# Patient Record
Sex: Male | Born: 1990 | Race: White | Hispanic: No | Marital: Married | Smoking: Never smoker
Health system: Southern US, Community
[De-identification: ages and names within clinical notes are randomized; demographics above are authoritative.]

## PROBLEM LIST (undated history)

## (undated) HISTORY — PX: WISDOM TOOTH EXTRACTION: SHX21

---

## 2019-06-10 ENCOUNTER — Emergency Department (HOSPITAL_COMMUNITY): Payer: Self-pay

## 2019-06-10 ENCOUNTER — Emergency Department (HOSPITAL_COMMUNITY)
Admission: EM | Admit: 2019-06-10 | Discharge: 2019-06-11 | Disposition: A | Discharge status: Home / Self Care | Payer: Self-pay | Attending: Emergency Medicine | Admitting: Emergency Medicine

## 2019-06-10 ENCOUNTER — Encounter (HOSPITAL_COMMUNITY): Payer: Self-pay

## 2019-06-10 ENCOUNTER — Other Ambulatory Visit: Payer: Self-pay

## 2019-06-10 DIAGNOSIS — Z20828 Contact with and (suspected) exposure to other viral communicable diseases: Secondary | ICD-10-CM | POA: Insufficient documentation

## 2019-06-10 DIAGNOSIS — J189 Pneumonia, unspecified organism: Secondary | ICD-10-CM | POA: Insufficient documentation

## 2019-06-10 LAB — CBC
HCT: 43 % (ref 39.0–52.0)
Hemoglobin: 14.4 g/dL (ref 13.0–17.0)
MCH: 31.6 pg (ref 26.0–34.0)
MCHC: 33.5 g/dL (ref 30.0–36.0)
MCV: 94.3 fL (ref 80.0–100.0)
Platelets: 243 10*3/uL (ref 150–400)
RBC: 4.56 MIL/uL (ref 4.22–5.81)
RDW: 12.2 % (ref 11.5–15.5)
WBC: 13.3 10*3/uL — ABNORMAL HIGH (ref 4.0–10.5)
nRBC: 0 % (ref 0.0–0.2)

## 2019-06-10 MED ORDER — SODIUM CHLORIDE 0.9% FLUSH: 3.0000 mL | Freq: Once | INTRAVENOUS | Status: DC

## 2019-06-10 NOTE — ED Triage Notes (Signed)
Pt states that two days ago he began having L sided CP that radiates to back, some SOB, denies n/v.

## 2019-06-10 NOTE — ED Notes (Signed)
Travis Huerta [phone] would like to be called when she can come back with patient

## 2019-06-11 ENCOUNTER — Emergency Department (HOSPITAL_COMMUNITY): Payer: Self-pay

## 2019-06-11 LAB — BASIC METABOLIC PANEL
Anion gap: 12 (ref 5–15)
BUN: 10 mg/dL (ref 6–20)
CO2: 26 mmol/L (ref 22–32)
Calcium: 9.3 mg/dL (ref 8.9–10.3)
Chloride: 99 mmol/L (ref 98–111)
Creatinine, Ser: 1.06 mg/dL (ref 0.61–1.24)
GFR calc Af Amer: >60 mL/min (ref >60)
GFR calc non Af Amer: >60 mL/min (ref >60)
Glucose, Bld: 114 mg/dL — ABNORMAL HIGH (ref 70–99)
Potassium: 3.8 mmol/L (ref 3.5–5.1)
Sodium: 137 mmol/L (ref 135–145)

## 2019-06-11 LAB — TROPONIN I (HIGH SENSITIVITY)
Troponin I (High Sensitivity): <2 ng/L (ref <18)
Troponin I (High Sensitivity): 3 ng/L (ref <18)

## 2019-06-11 LAB — SARS CORONAVIRUS 2 (TAT 6-24 HRS): SARS Coronavirus 2: NEGATIVE

## 2019-06-11 MED ORDER — AMOXICILLIN 500 MG PO CAPS: 1000.0000 mg | ORAL_CAPSULE | Freq: Two times a day (BID) | ORAL | 0 refills | Status: DC

## 2019-06-11 MED ORDER — AZITHROMYCIN 250 MG PO TABS: 250.0000 mg | ORAL_TABLET | Freq: Every day | ORAL | 0 refills | Status: DC

## 2019-06-11 MED ORDER — HYDROCODONE-ACETAMINOPHEN 5-325 MG PO TABS: 1.0000 | ORAL_TABLET | ORAL | 0 refills | Status: DC | PRN

## 2019-06-11 MED ORDER — SODIUM CHLORIDE 0.9 % IV BOLUS
1000.0000 mL | Freq: Once | INTRAVENOUS | Status: AC
  Administered 2019-06-11: 06:00:00 1000 mL via INTRAVENOUS

## 2019-06-11 MED ORDER — IOHEXOL 350 MG/ML SOLN
75.0000 mL | Freq: Once | INTRAVENOUS | Status: AC | PRN
  Administered 2019-06-11: 06:00:00 75 mL via INTRAVENOUS

## 2019-06-11 NOTE — ED Provider Notes (Signed)
[facility] EMERGENCY DEPARTMENT Provider Note   CSN: [id number] Arrival date & time: 06/10/19  2314    History   Chief Complaint Chief Complaint  Patient presents with  . Chest Pain    HPI Travis Huerta is a 28 y.o. male.     Patient presents to the emergency department for evaluation of chest pain.  Patient had onset of left-sided chest pain 2 days ago.  Pain has been waxing and waning but persistent.  Pain is in the left side of his chest and radiates through into his back and into the left shoulder area.  He reports significant worsening of pain with taking deep breaths.  He does not feel particularly short of breath.  No history of DVT or PE.  He has not noticed any calf or leg swelling or pain.  He did, however, fly to [place] state 3 weeks ago.  No recent surgery.     History reviewed. No pertinent past medical history.  There are no active problems to display for this patient.   History reviewed. No pertinent surgical history.      Home Medications    Prior to Admission medications   Medication Sig Start Date End Date Taking? Authorizing Provider  ibuprofen (ADVIL) 200 MG tablet Take 200 mg by mouth every 6 (six) hours as needed for mild pain.   Yes [provider]    Family History No family history on file.  Social History Social History   Tobacco Use  . Smoking status: Never Smoker  . Smokeless tobacco: Never Used  Substance Use Topics  . Alcohol use: Yes  . Drug use: Never     Allergies   Patient has no known allergies.   Review of Systems Review of Systems  Cardiovascular: Positive for chest pain.  All other systems reviewed and are negative.    Physical Exam Updated Vital Signs BP (!) 126/91   Pulse (!) 109   Temp 98 F (36.7 C) (Oral)   Resp (!) 25   Ht 5\' 7"  (1.702 m)   Wt 81.6 kg   SpO2 98%   BMI 28.19 kg/m   Physical Exam Vitals signs and nursing note reviewed.  Constitutional:    General: He is not in acute distress.    Appearance: Normal appearance. He is well-developed.  HENT:     Head: Normocephalic and atraumatic.     Right Ear: Hearing normal.     Left Ear: Hearing normal.     Nose: Nose normal.  Eyes:     Conjunctiva/sclera: Conjunctivae normal.     Pupils: Pupils are equal, round, and reactive to light.  Neck:     Musculoskeletal: Normal range of motion and neck supple.  Cardiovascular:     Rate and Rhythm: Regular rhythm. Tachycardia present.     Heart sounds: S1 normal and S2 normal. No murmur. No friction rub. No gallop.   Pulmonary:     Effort: Pulmonary effort is normal. No respiratory distress.     Breath sounds: Normal breath sounds.  Chest:     Chest wall: No tenderness.  Abdominal:     General: Bowel sounds are normal.     Palpations: Abdomen is soft.     Tenderness: There is no abdominal tenderness. There is no guarding or rebound. Negative signs include Murphy's sign and McBurney's sign.     Hernia: No hernia is present.  Musculoskeletal: Normal range of motion.  Skin:    General: Skin  is warm and dry.     Findings: No rash.  Neurological:     Mental Status: He is alert and oriented to person, place, and time.     GCS: GCS eye subscore is 4. GCS verbal subscore is 5. GCS motor subscore is 6.     Cranial Nerves: No cranial nerve deficit.     Sensory: No sensory deficit.     Coordination: Coordination normal.  Psychiatric:        Speech: Speech normal.        Behavior: Behavior normal.        Thought Content: Thought content normal.      ED Treatments / Results  Labs (all labs ordered are listed, but only abnormal results are displayed) Labs Reviewed  BASIC METABOLIC PANEL - Abnormal; Notable for the following components:      Result Value   Glucose, Bld 114 (*)    All other components within normal limits  CBC - Abnormal; Notable for the following components:   WBC 13.3 (*)    All other components within normal limits   NOVEL CORONAVIRUS, NAA (HOSPITAL ORDER, SEND-OUT TO REF LAB)  TROPONIN I (HIGH SENSITIVITY)  TROPONIN I (HIGH SENSITIVITY)    EKG EKG Interpretation  Date/Time:  Friday June 10 2019 23:19:49 EDT Ventricular Rate:  120 PR Interval:  138 QRS Duration: 80 QT Interval:  312 QTC Calculation: 440 R Axis:   80 Text Interpretation:  Sinus tachycardia Nonspecific T wave abnormality Abnormal ECG No previous tracing Confirmed by Orpah Greek [phone]) on 06/11/2019 4:33:11 AM   Radiology Dg Chest 2 View  Result Date: 06/10/2019 CLINICAL DATA:  Chest pain and shortness of breath EXAM: CHEST - 2 VIEW COMPARISON:  None. FINDINGS: Cardiac shadows within normal limits. The lungs are well aerated bilaterally. Mild left basilar atelectasis/infiltrate is seen. No sizable effusion is noted. No bony abnormality is seen. IMPRESSION: Opacities in the left base which may represent atelectasis or early infiltrate. Electronically Signed   By: Inez Catalina M.D.   On: 06/10/2019 23:54   Ct Angio Chest Pe W Or Wo Contrast  Result Date: 06/11/2019 CLINICAL DATA:  Chest pain and dyspnea for 2 days. EXAM: CT ANGIOGRAPHY CHEST WITH CONTRAST TECHNIQUE: Multidetector CT imaging of the chest was performed using the standard protocol during bolus administration of intravenous contrast. Multiplanar CT image reconstructions and MIPs were obtained to evaluate the vascular anatomy. CONTRAST:  21mL OMNIPAQUE IOHEXOL 350 MG/ML SOLN COMPARISON:  Chest radiograph from one day prior. FINDINGS: Cardiovascular: The study is moderate quality for the evaluation of pulmonary embolism, with mild motion degradation. There are no convincing filling defects in the central, lobar, segmental or subsegmental pulmonary artery branches to suggest acute pulmonary embolism. Great vessels are normal in course and caliber. Normal heart size. No significant pericardial fluid/thickening. Mediastinum/Nodes: No discrete thyroid nodules. Unremarkable  esophagus. No pathologically enlarged axillary, mediastinal or hilar lymph nodes. Lungs/Pleura: No pneumothorax. Small dependent left pleural effusion. No right pleural effusion. Patchy consolidation in the lingula and left lower lobe with surrounding patchy ground-glass opacity. No lung masses or significant pulmonary nodules. Upper abdomen: No acute abnormality. Musculoskeletal:  No aggressive appearing focal osseous lesions. Review of the MIP images confirms the above findings. IMPRESSION: 1. Mildly motion degraded scan. No convincing evidence of pulmonary embolism. 2. Small dependent left pleural effusion. 3. Patchy consolidation in the lingula and left lower lobe with surrounding patchy ground-glass opacity, suspect a combination of pneumonia and atelectasis. Electronically Signed  By: Ilona Sorrel M.D.   On: 06/11/2019 05:48    Procedures Procedures (including critical care time)  Medications Ordered in ED Medications  sodium chloride flush (NS) 0.9 % injection 3 mL (has no administration in time range)  sodium chloride 0.9 % bolus 1,000 mL (1,000 mLs Intravenous New Bag/Given 06/11/19 0603)  iohexol (OMNIPAQUE) 350 MG/ML injection 75 mL (75 mLs Intravenous Contrast Given 06/11/19 0533)     Initial Impression / Assessment and Plan / ED Course  I have reviewed the triage vital signs and the nursing notes.  Pertinent labs & imaging results that were available during my care of the patient were reviewed by me and considered in my medical decision making (see chart for details).        Patient presents for evaluation of chest pain.  Patient reports several days of left-sided chest pain, worse tonight.  He has noticed increased pain when he lies down.  No cardiac risk factors.  Cardiac evaluation unremarkable.  X-ray shows evidence of infiltrate at the left base.  Patient does have a pleuritic component to his pain and is tachycardic.  He does, however, reports that his heart rate is normally in  the 100s.  Patient recently traveled by plane to [place] state.  He therefore does have risk factors for PE.  CT angiography of chest was performed.  No evidence of PE noted but area seen on x-ray is consistent with pneumonia.  Patient's tachycardia improving with IV fluids.  Heart rate in the 110 range now.  He appears well otherwise.  No hypoxia.  Will treat with antibiotics and analgesia, appropriate for outpatient management.  Patient given strict return precautions.  Will perform outpatient COVID-19 testing.  Final Clinical Impressions(s) / ED Diagnoses   Final diagnoses:  Community acquired pneumonia of left lower lobe of lung [facility])    ED Discharge Orders    None       Koraline Phillipson, Gwenyth Allegra, MD 06/11/19 [phone]

## 2019-06-11 NOTE — ED Notes (Signed)
The pt has had sob for 2-3 days  Recent  50 mile hike in [place] state  No leg pain jsut sob and some chest pain

## 2019-06-11 NOTE — ED Notes (Signed)
Pt returned from c-t  Cannot lie back without severe chest pain.. more comfortable whenever he sits straight upright

## 2019-06-24 ENCOUNTER — Other Ambulatory Visit: Payer: Self-pay

## 2019-06-24 ENCOUNTER — Encounter: Payer: Self-pay | Admitting: Critical Care Medicine

## 2019-06-24 ENCOUNTER — Ambulatory Visit (INDEPENDENT_AMBULATORY_CARE_PROVIDER_SITE_OTHER): Payer: Self-pay

## 2019-06-24 ENCOUNTER — Ambulatory Visit (INDEPENDENT_AMBULATORY_CARE_PROVIDER_SITE_OTHER): Payer: Self-pay | Admitting: Critical Care Medicine

## 2019-06-24 VITALS — BP 128/80 | HR 102 | Temp 98.4°F | Ht 67.0 in | Wt 189.2 lb

## 2019-06-24 DIAGNOSIS — J189 Pneumonia, unspecified organism: Secondary | ICD-10-CM

## 2019-06-24 DIAGNOSIS — J90 Pleural effusion, not elsewhere classified: Secondary | ICD-10-CM

## 2019-06-24 DIAGNOSIS — J918 Pleural effusion in other conditions classified elsewhere: Secondary | ICD-10-CM

## 2019-06-24 NOTE — Patient Instructions (Signed)
Thank you for visiting Dr. Tonnette Zwiebel at [facility]. We recommend the following:   Return if symptoms worsen or fail to improve.    Please do your part to reduce the spread of COVID-19. ' 

## 2019-06-24 NOTE — Progress Notes (Signed)
Synopsis: Referred in August 2020 for follow-up of community-acquired pneumonia by Joseph Berkshire, MD.  Subjective:   PATIENT ID: Travis Huerta GENDER: male DOB: 06/28/1991, MRN: [id number]  Chief Complaint  Patient presents with  . Consult    F/U for PNA. Was seen in the ED on 8/7 for PNA. States he is feeling much better.     Mr. Weiand is a 28 year old gentleman who was recently evaluated in the emergency department and found to have a left lower lobe pneumonia. At that time he was experiencing pleuritic left-sided CP and SOB due to the pain, but denies having had cough, sputum production, fevers, or chills. In the emergency department a CTA was performed due to concern for a PE given recent prolonged air travel cross-country, demonstrated a dependent pleural effusion on the left..  Was prescribed a 10-day course of amoxicillin and a Z-Pack in the ED. He completed his antibiotics and reports almost complete resolution of the pain.  He only occasionally has pain with coughing.  No shortness of breath, chills, or fevers.  Prior to his illness he did not have any sick contacts.  He has no history of pulmonary infections or immune deficiencies in the past.  He is a never-smoker/vaper.     History reviewed. No pertinent past medical history.   Family History  Problem Relation Age of Onset  . Hypertension Father   . Diabetes Maternal Grandmother      Past Surgical History:  Procedure Laterality Date  . WISDOM TOOTH EXTRACTION      Social History   Socioeconomic History  . Marital status: Single    Spouse name: Not on file  . Number of children: Not on file  . Years of education: Not on file  . Highest education level: Not on file  Occupational History  . Not on file  Social Needs  . Financial resource strain: Not on file  . Food insecurity    Worry: Not on file    Inability: Not on file  . Transportation needs    Medical: Not on file    Non-medical: Not on file   Tobacco Use  . Smoking status: Never Smoker  . Smokeless tobacco: Never Used  Substance and Sexual Activity  . Alcohol use: Yes    Comment: occasional on the weekends  . Drug use: Never  . Sexual activity: Not on file  Lifestyle  . Physical activity    Days per week: Not on file    Minutes per session: Not on file  . Stress: Not on file  Relationships  . Social Herbalist on phone: Not on file    Gets together: Not on file    Attends religious service: Not on file    Active member of club or organization: Not on file    Attends meetings of clubs or organizations: Not on file    Relationship status: Not on file  . Intimate partner violence    Fear of current or ex partner: Not on file    Emotionally abused: Not on file    Physically abused: Not on file    Forced sexual activity: Not on file  Other Topics Concern  . Not on file  Social History Narrative  . Not on file     No Known Allergies    There is no immunization history on file for this patient.  Outpatient Medications Prior to Visit  Medication Sig Dispense Refill  . amoxicillin (AMOXIL)  500 MG capsule Take 2 capsules (1,000 mg total) by mouth 2 (two) times daily. 40 capsule 0  . azithromycin (ZITHROMAX) 250 MG tablet Take 1 tablet (250 mg total) by mouth daily. Take first 2 tablets together, then 1 every day until finished. 6 tablet 0  . HYDROcodone-acetaminophen (NORCO/VICODIN) 5-325 MG tablet Take 1 tablet by mouth every 4 (four) hours as needed for moderate pain. 10 tablet 0  . ibuprofen (ADVIL) 200 MG tablet Take 200 mg by mouth every 6 (six) hours as needed for mild pain.     No facility-administered medications prior to visit.     Review of Systems  Constitutional: Negative for chills, fever and weight loss.  HENT: Negative.   Eyes: Negative.   Respiratory: Negative for sputum production, shortness of breath and wheezing.   Cardiovascular: Negative for chest pain and leg swelling.   Gastrointestinal: Negative for blood in stool, nausea and vomiting.  Genitourinary: Negative.   Musculoskeletal: Negative for myalgias.  Skin: Negative for itching and rash.  Neurological: Negative.      Objective:   Vitals:   06/24/19 1423  BP: 128/80  Pulse: (!) 102  Temp: 98.4 F (36.9 C)  TempSrc: Oral  SpO2: 99%  Weight: 189 lb 3.2 oz (85.8 kg)  Height: 5\' 7"  (1.702 m)   99% on RA BMI Readings from Last 3 Encounters:  06/24/19 29.63 kg/m  06/10/19 28.19 kg/m   Wt Readings from Last 3 Encounters:  06/24/19 189 lb 3.2 oz (85.8 kg)  06/10/19 180 lb (81.6 kg)    Physical Exam Vitals signs reviewed.  Constitutional:      Appearance: Normal appearance. He is not ill-appearing.  HENT:     Head: Normocephalic and atraumatic.     Nose:     Comments: Deferred due to masking requirement.    Mouth/Throat:     Comments: Deferred due to masking requirement. Neck:     Musculoskeletal: Neck supple.  Cardiovascular:     Heart sounds: No murmur.     Comments: Mild tachycardia, regular rhythm Pulmonary:     Comments: Clear to auscultation bilaterally, breathing comfortably on room air and speaking in full sentences. Abdominal:     General: Abdomen is flat. There is no distension.     Palpations: Abdomen is soft.  Musculoskeletal:        General: No swelling or tenderness.  Lymphadenopathy:     Cervical: No cervical adenopathy.  Skin:    General: Skin is warm and dry.     Findings: No rash.  Neurological:     Mental Status: He is alert. Mental status is at baseline.     Motor: No weakness.     Coordination: Coordination normal.     Gait: Gait normal.  Psychiatric:        Mood and Affect: Mood normal.        Behavior: Behavior normal.      CBC    Component Value Date/Time   WBC 13.3 (H) 06/10/2019 2335   RBC 4.56 06/10/2019 2335   HGB 14.4 06/10/2019 2335   HCT 43.0 06/10/2019 2335   PLT 243 06/10/2019 2335   MCV 94.3 06/10/2019 2335   MCH 31.6  06/10/2019 2335   MCHC 33.5 06/10/2019 2335   RDW 12.2 06/10/2019 2335    Labs from his ED visit on 06/10/2019 demonstrated a leukocytosis, essentially normal basic metabolic panel, and normal troponin levels.  SARS-CoV-2 negative.  Chest Imaging- films reviewed with the patient: CT chest  with contrast 06/11/2019- no mediastinal adenopathy.  No pulmonary embolus.  Patchy opacities of the left lower lobe and posterior lingula.air bronchograms.  Small dependent pleural effusion on the left without apparent loculation.  CXR 06/24/2019- resolving left lower lobe pneumonia.  No residual pleural effusion.  Pulmonary Functions Testing Results: No flowsheet data found.      Assessment & Plan:     ICD-10-CM   1. Parapneumonic effusion  J18.9    J91.8    Parapneumonic effusion associated with community-acquired pneumonia.  Resolved on most recent chest x-ray. -No additional intervention is required. -If he has residual pleuritic pain, he can use over-the-counter pain medications for relief. This should resolve within the next few weeks.  Follow-up PRN.  No current outpatient medications.   Julian Hy, DO [facility] 06/24/2019 2:59 PM

## 2019-06-24 NOTE — Progress Notes (Signed)
   Subjective:    Patient ID: Travis Huerta, male    DOB: 17-Nov-1990, 28 y.o.   MRN: [id number]  HPI    Review of Systems  Constitutional: Negative for fever and unexpected weight change.  HENT: Negative for congestion, dental problem, ear pain, nosebleeds, postnasal drip, rhinorrhea, sinus pressure, sneezing, sore throat and trouble swallowing.   Eyes: Negative for redness and itching.  Respiratory: Negative for cough, shortness of breath and wheezing.   Cardiovascular: Positive for chest pain. Negative for palpitations and leg swelling.  Gastrointestinal: Negative for nausea and vomiting.  Genitourinary: Negative for dysuria.  Musculoskeletal: Negative for joint swelling.  Skin: Negative for rash.  Allergic/Immunologic: Negative.  Negative for environmental allergies, food allergies and immunocompromised state.  Neurological: Negative for headaches.  Hematological: Does not bruise/bleed easily.  Psychiatric/Behavioral: Negative for dysphoric mood. The patient is not nervous/anxious.        Objective:   Physical Exam        Assessment & Plan:

## 2020-07-28 IMAGING — CR CHEST - 2 VIEW
2 series · 2 of 2 positions shown · non-contrast
Comparison: None.

CLINICAL DATA: Chest pain and shortness of breath

EXAM:
CHEST - 2 VIEW

[chest pa]
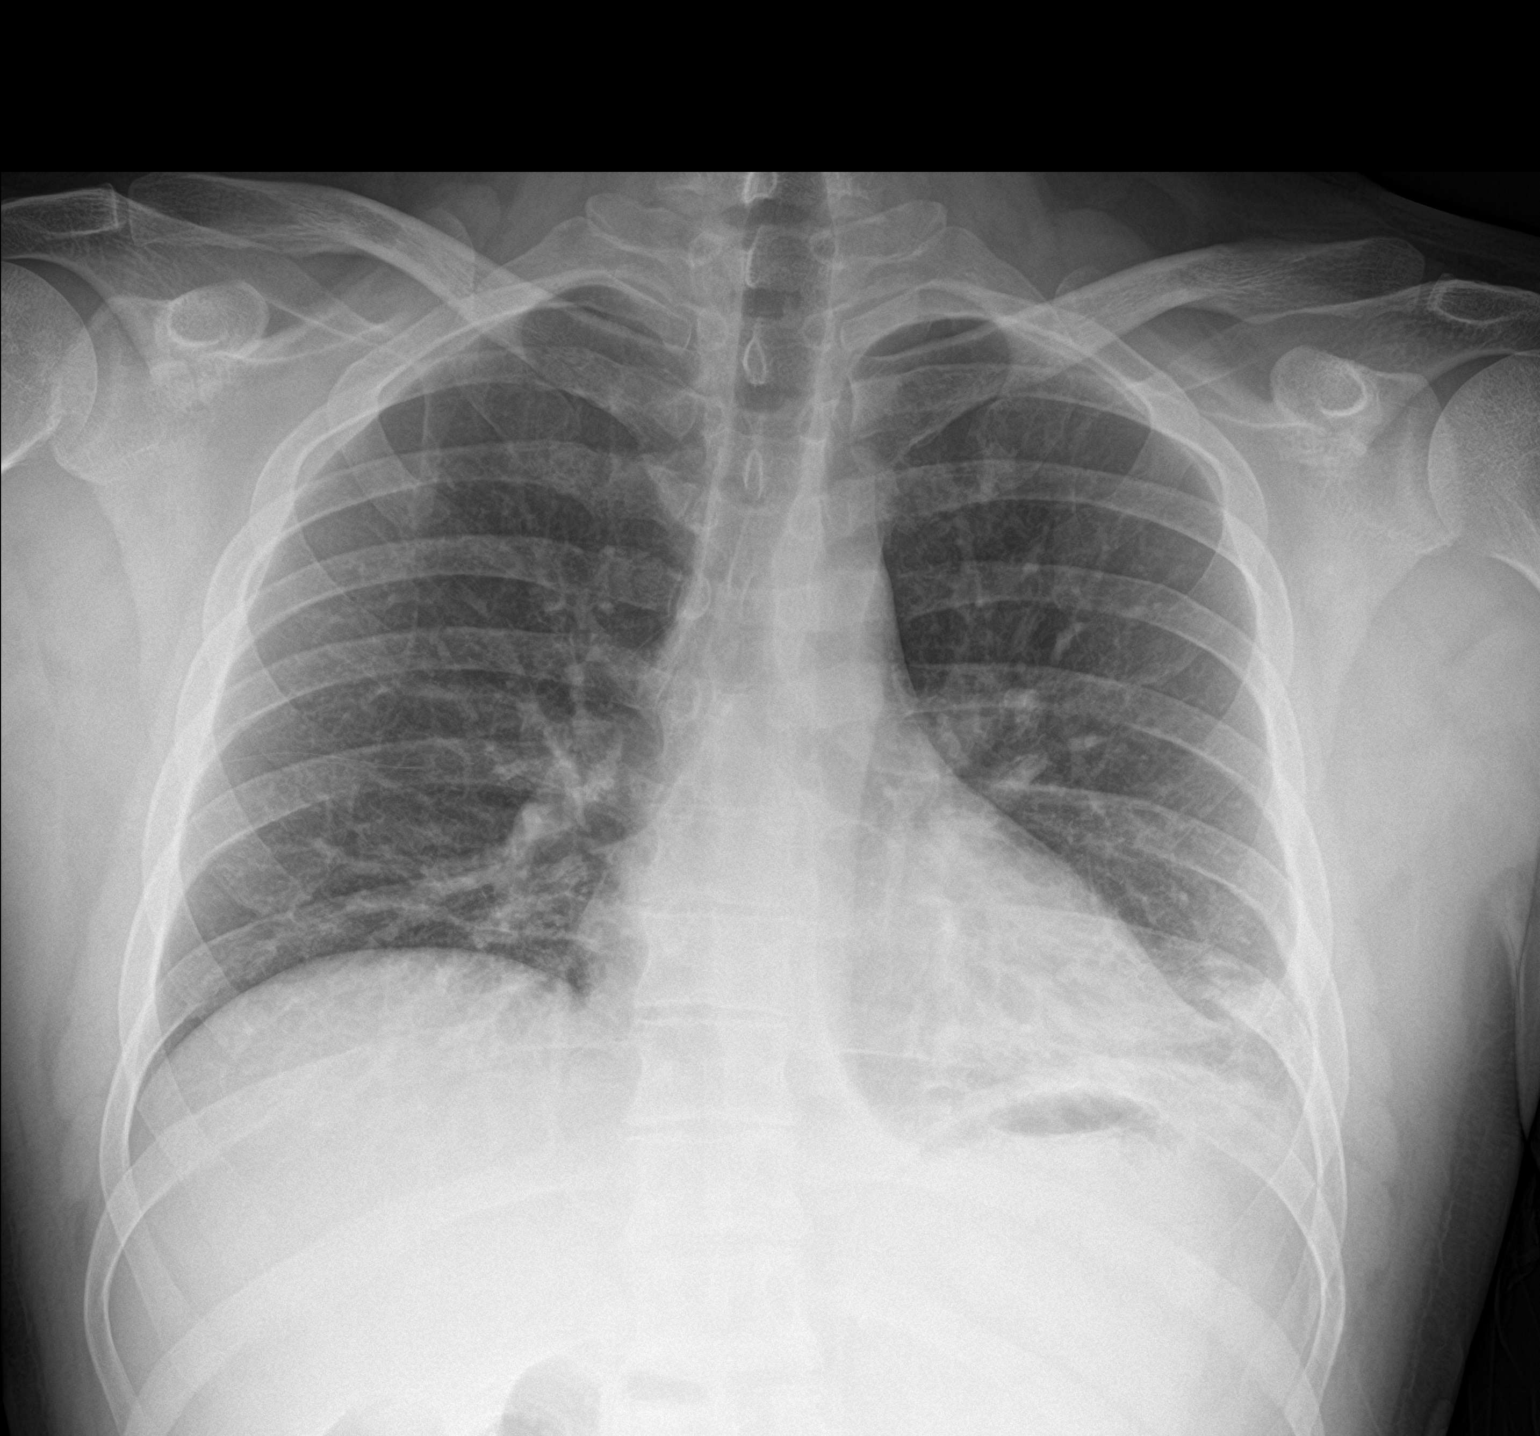

[chest lat]
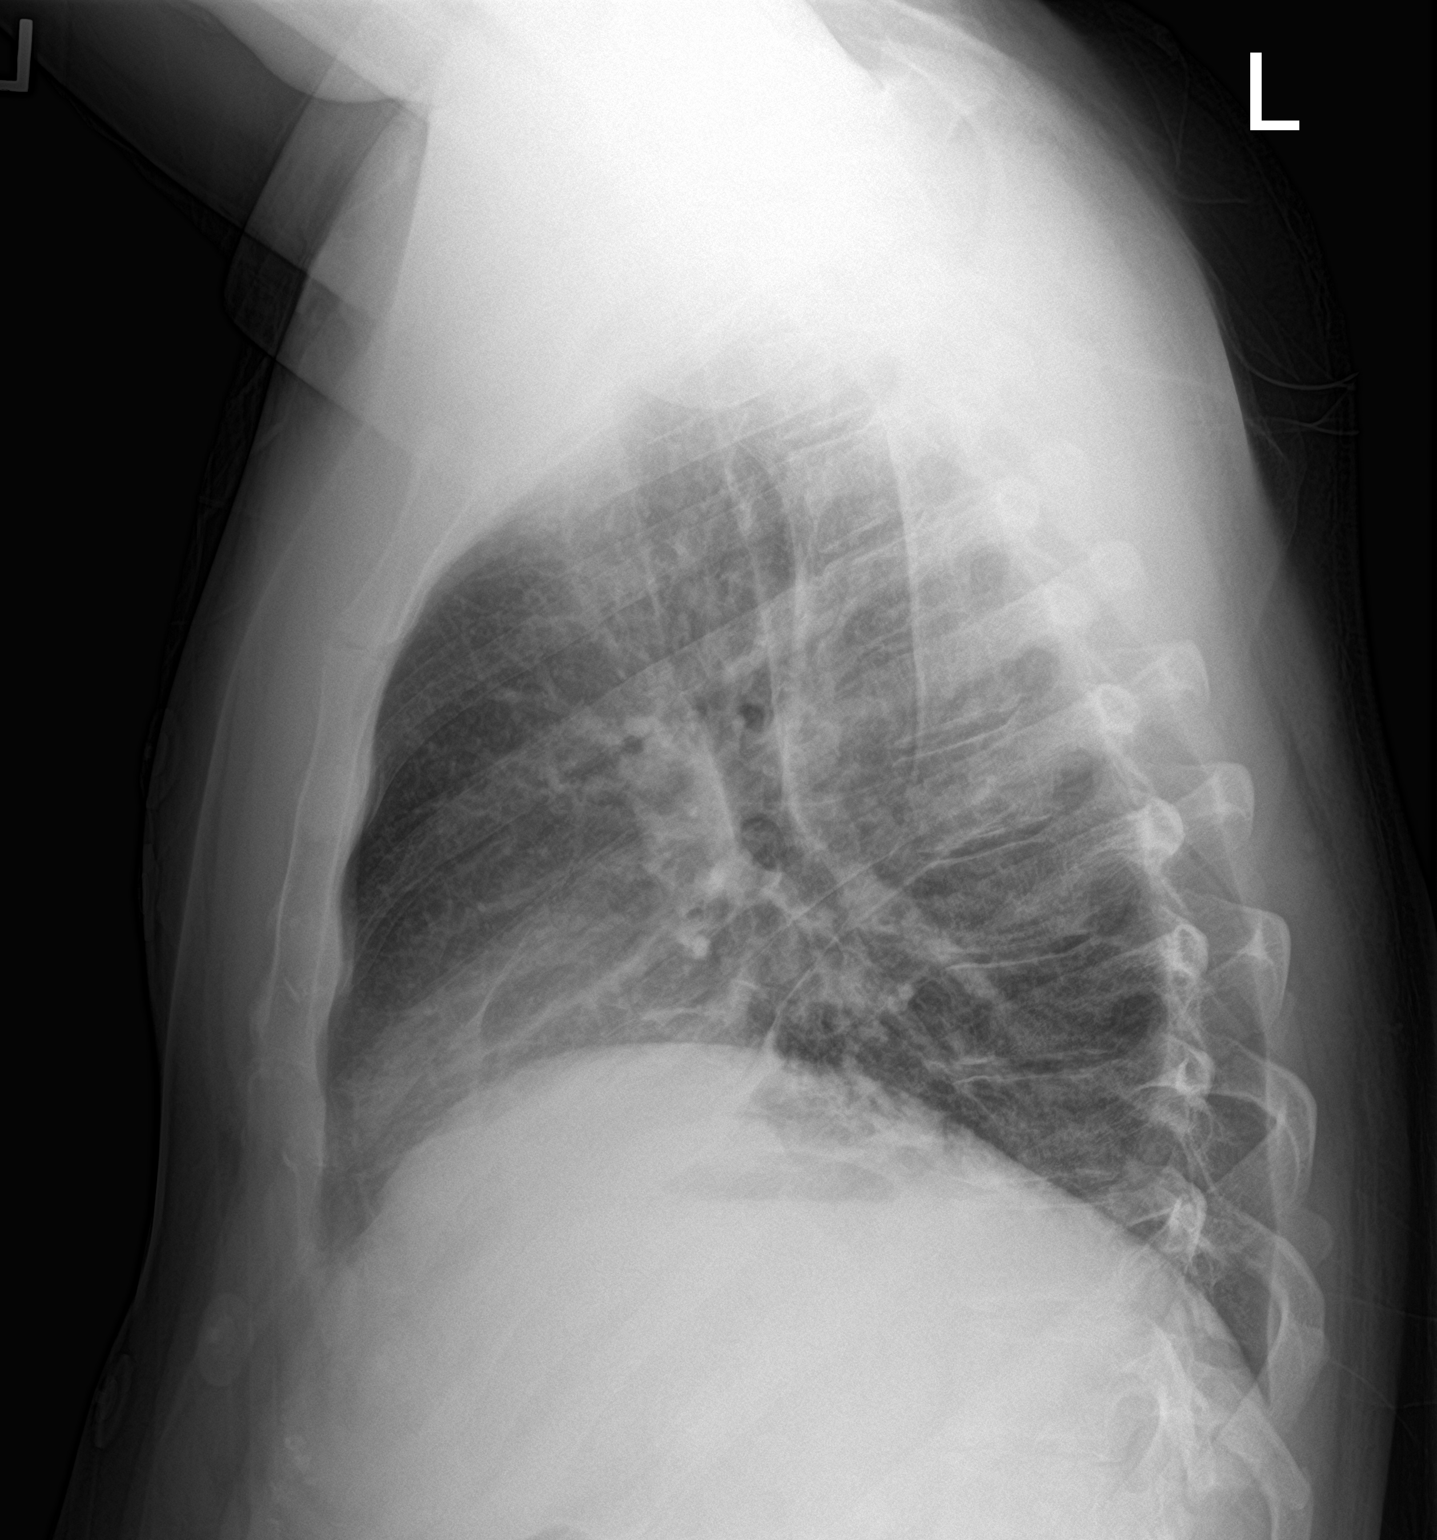

[2 of 2 positions shown; findings below may reference images not displayed]

FINDINGS: Cardiac shadows within normal limits. The lungs are well aerated
bilaterally. Mild left basilar atelectasis/infiltrate is seen. No
sizable effusion is noted. No bony abnormality is seen.
IMPRESSION: Opacities in the left base which may represent atelectasis or early
infiltrate.

## 2020-08-11 IMAGING — DX CHEST - 2 VIEW
2 series · 2 of 2 positions shown · non-contrast
Comparison: Chest radiograph June 10, 2019 and chest CT June 11, 2019

CLINICAL DATA: Recent pleural effusion

EXAM:
CHEST - 2 VIEW

[chest pa]
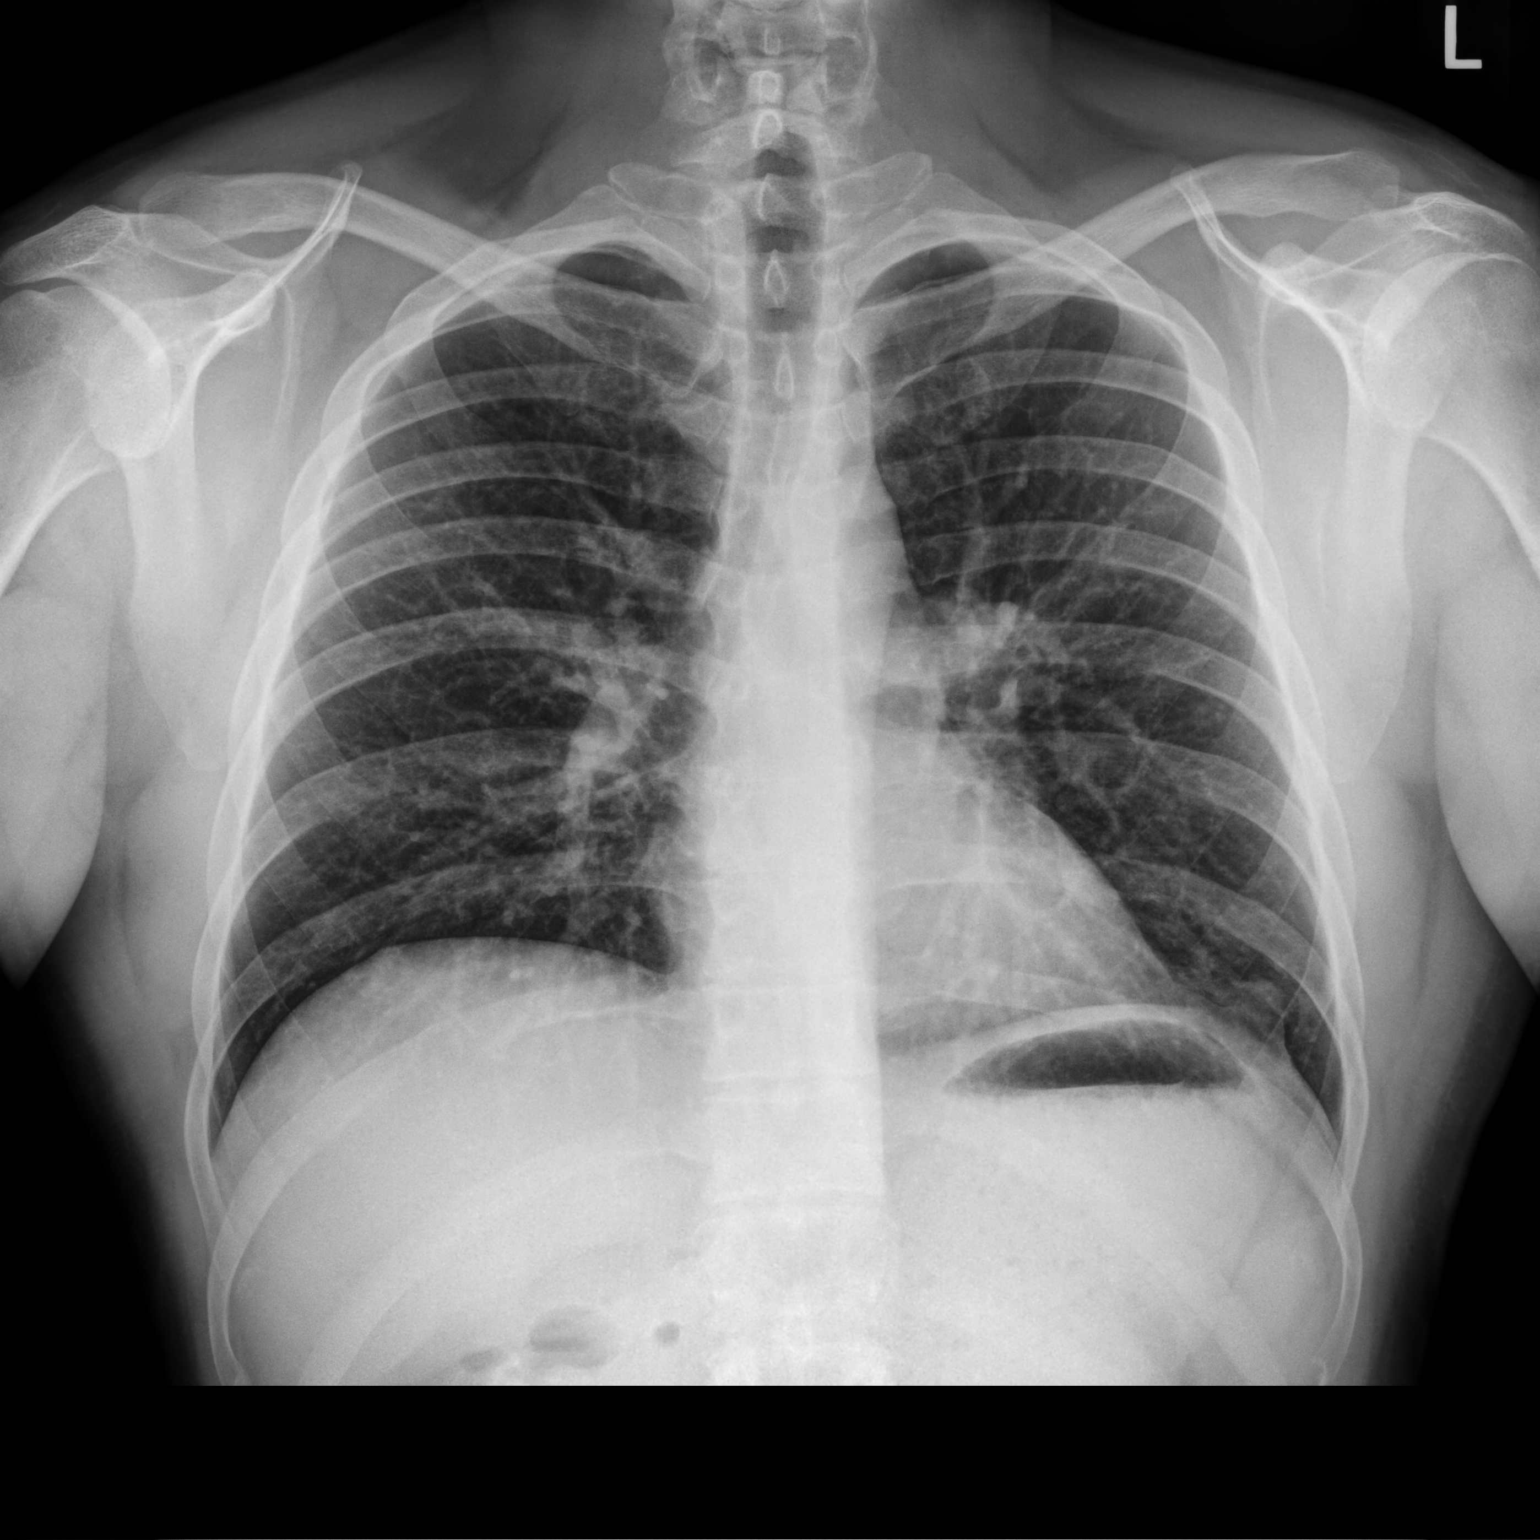

[chest lat]
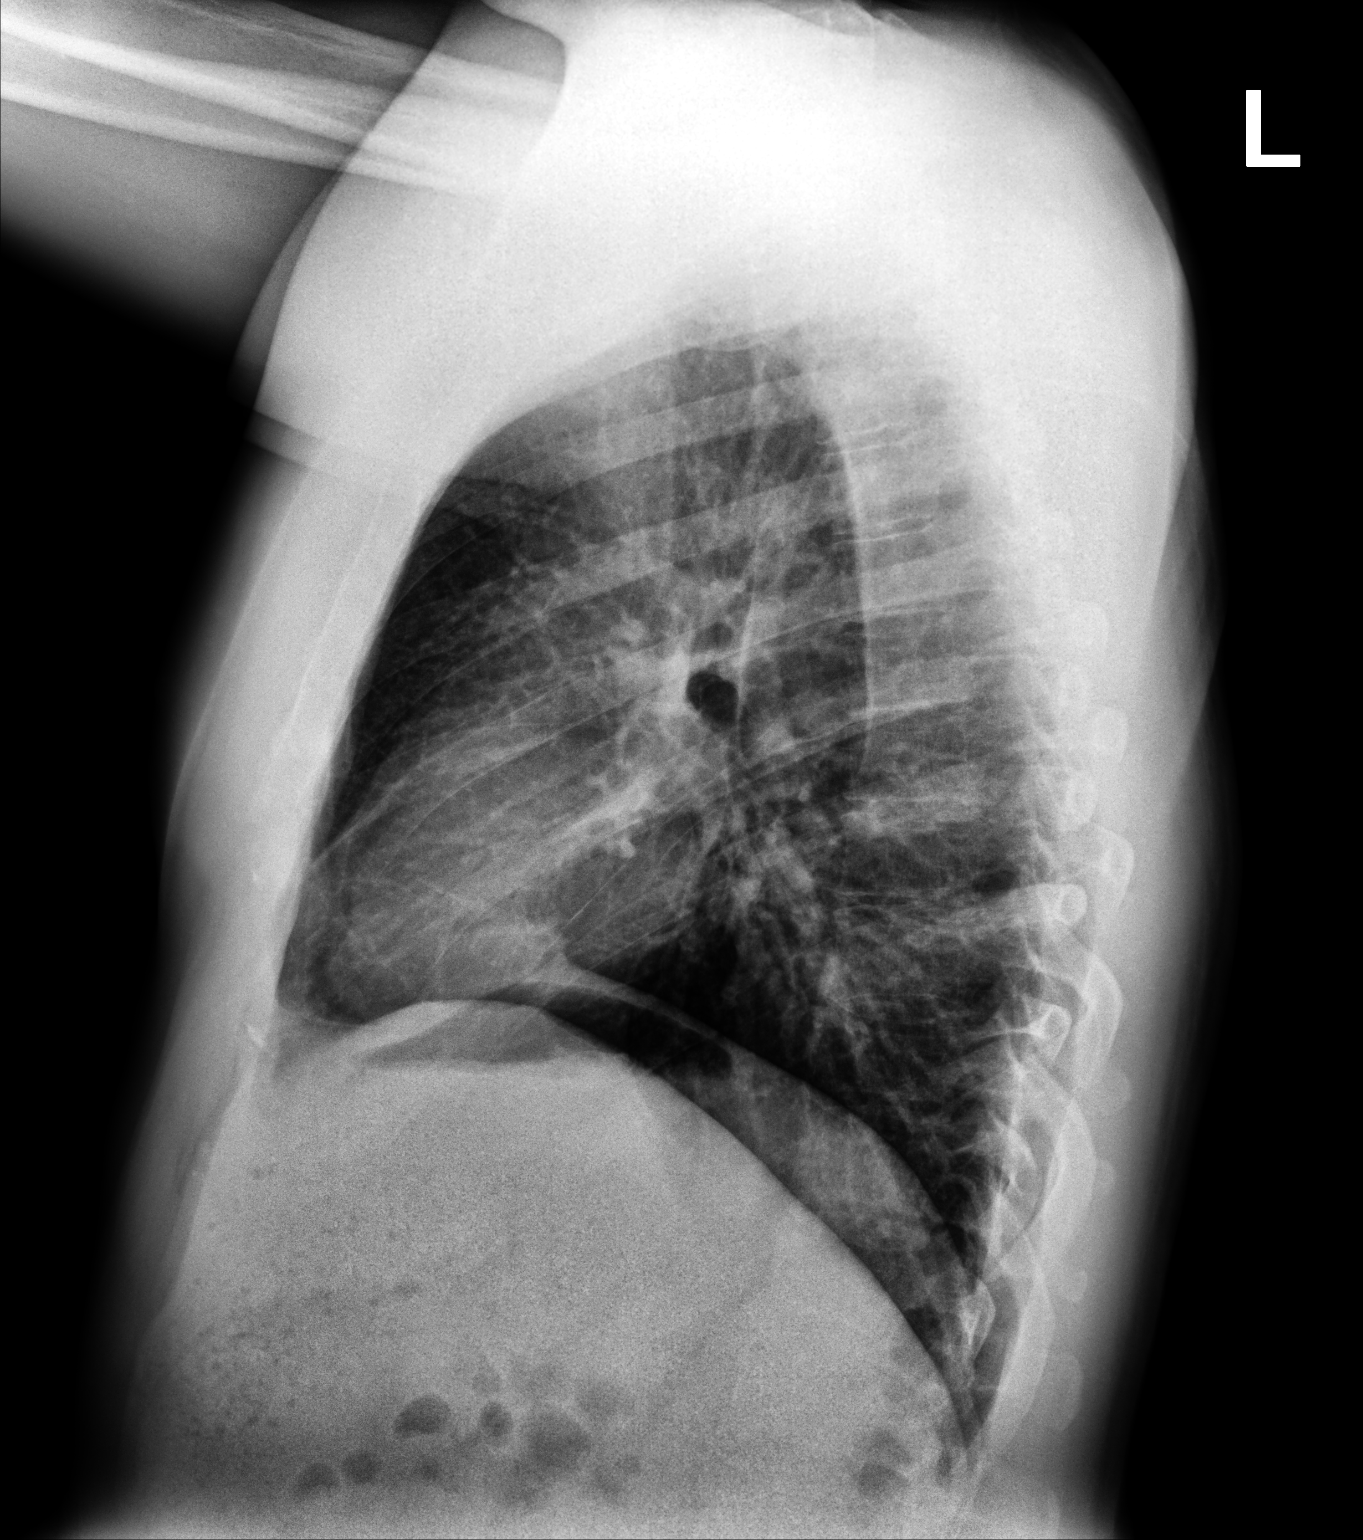

[2 of 2 positions shown; findings below may reference images not displayed]

FINDINGS: There is mild atelectasis in the anterior left base. No pleural
effusion evident. Lungs elsewhere are clear. Heart size and
pulmonary vascularity are normal. No adenopathy. No bone lesions.
IMPRESSION: No pleural effusion. Slight atelectasis anterior left base. Lungs
elsewhere clear. No adenopathy.

## 2022-03-19 ENCOUNTER — Encounter (HOSPITAL_BASED_OUTPATIENT_CLINIC_OR_DEPARTMENT_OTHER): Payer: Self-pay | Admitting: Family Medicine

## 2022-03-19 ENCOUNTER — Ambulatory Visit (HOSPITAL_BASED_OUTPATIENT_CLINIC_OR_DEPARTMENT_OTHER): Payer: 59 | Admitting: Family Medicine

## 2022-03-19 DIAGNOSIS — Z0000 Encounter for general adult medical examination without abnormal findings: Secondary | ICD-10-CM

## 2022-03-19 DIAGNOSIS — R030 Elevated blood-pressure reading, without diagnosis of hypertension: Secondary | ICD-10-CM

## 2022-03-19 NOTE — Progress Notes (Signed)
? ?New Patient Office Visit ? ?Subjective   ? ?Patient ID: Travis Huerta, male    DOB: 1991-07-09  Age: 31 y.o. MRN: [id number] ? ?CC:  ?Chief Complaint  ?Patient presents with  ? New Patient (Initial Visit)  ? ? ?HPI ?Travis Huerta presents to establish care ?Last PCP - unsure, has been at least a few years ?Denies any prior medical issues, no medications that he takes regularly ?Is wanting to have baseline labs completed ?Does have questions about BP as there is a family history of HTN ? ?Patient is originally from [place], [place]. Patient works for [employer], he is an Economist. 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 ? ?No outpatient encounter medications on file as of 03/19/2022.  ? ?No facility-administered encounter medications on file as of 03/19/2022.  ? ? ?History reviewed. No pertinent past medical history. ? ?Past Surgical History:  ?Procedure Laterality Date  ? WISDOM TOOTH EXTRACTION    ? ? ?Family History  ?Problem Relation Age of Onset  ? Hypertension Father   ? Diabetes Maternal Grandmother   ? ? ?Social History  ? ?Socioeconomic History  ? Marital status: Single  ?  Spouse name: Not on file  ? Number of children: Not on file  ? Years of education: Not on file  ? Highest education level: Not on file  ?Occupational History  ? Not on file  ?Tobacco Use  ? Smoking status: Never  ? Smokeless tobacco: Never  ?Vaping Use  ? Vaping Use: Never used  ?Substance and Sexual Activity  ? Alcohol use: Yes  ?  Comment: occasional on the weekends  ? Drug use: Never  ? Sexual activity: Not on file  ?Other Topics Concern  ? Not on file  ?Social History Narrative  ? Not on file  ? ?Social Determinants of Health  ? ?Financial Resource Strain: Not on file  ?Food Insecurity: Not on file  ?Transportation Needs: Not on file  ?Physical Activity: Not on file  ?Stress: Not on file  ?Social Connections:  Not on file  ?Intimate Partner Violence: Not on file  ? ? ?Objective   ? ?BP (!) 143/96   Pulse 99   Temp 97.6 ?F (36.4 ?C) (Oral)   Ht '5\' 7"'$  (1.702 m)   Wt 189 lb 11.2 oz (86 kg)   SpO2 100%   BMI 29.71 kg/m?  ? ?Physical Exam ?Constitutional:   ?   General: He is not in acute distress. ?HENT:  ?   Head: Normocephalic and atraumatic.  ?   Right Ear: Tympanic membrane, ear canal and external ear normal.  ?   Left Ear: Tympanic membrane, ear canal and external ear normal.  ?   Nose: Nose normal.  ?   Mouth/Throat:  ?   Mouth: Mucous membranes are moist.  ?   Pharynx: Oropharynx is clear.  ?Eyes:  ?   Extraocular Movements: Extraocular movements intact.  ?   Conjunctiva/sclera: Conjunctivae normal.  ?   Pupils: Pupils are equal, round, and reactive to light.  ?Cardiovascular:  ?   Rate and Rhythm: Normal rate and regular rhythm.  ?   Pulses: Normal pulses.  ?   Heart sounds: Normal heart sounds. No murmur heard. ?Pulmonary:  ?   Effort: Pulmonary effort is normal.  ?   Breath sounds: Normal breath sounds. No wheezing.  ?Abdominal:  ?  General: Bowel sounds are normal. There is no distension.  ?   Palpations: Abdomen is soft.  ?   Tenderness: There is no abdominal tenderness. There is no guarding.  ?Musculoskeletal:  ?   Cervical back: Normal range of motion. No tenderness.  ?Skin: ?   General: Skin is warm.  ?   Capillary Refill: Capillary refill takes less than 2 seconds.  ?   Coloration: Skin is not jaundiced.  ?   Findings: No rash.  ?Neurological:  ?   General: No focal deficit present.  ?   Mental Status: He is alert and oriented to person, place, and time.  ?   Gait: Gait normal.  ?Psychiatric:     ?   Mood and Affect: Mood normal.     ?   Behavior: Behavior normal.  ? ? ?Assessment & Plan:  ? ?Problem List Items Addressed This Visit   ? ?  ? Other  ? Elevated blood pressure reading without diagnosis of hypertension  ?  Blood pressure slightly elevated in office today.  No personal history of  hypertension, however does have reported family history of hypertension.  Did discuss general lifestyle modifications and provided handout today.  Patient does have home cuff, recommend intermittent monitoring ?We will plan for follow-up in about 2 to 3 months to review blood pressure log and determine next steps such as continuing with lifestyle medications or if pharmacotherapy would need to be initiated ? ?  ?  ? Wellness examination  ?  Routine HCM labs ordered. HCM reviewed/discussed. Anticipatory guidance regarding healthy weight, lifestyle and choices given. ?Recommend healthy diet.  Recommend approximately 150 minutes/week of moderate intensity exercise ?Recommend regular dental and vision exams ?Always use seatbelt/lap and shoulder restraints ?Recommend using smoke alarms and checking batteries at least twice a year ?Recommend using sunscreen when outside ?Discussed tetanus immunization recommendations, patient is UTD ?  ?  ? Relevant Orders  ? CBC with Differential/Platelet  ? Comprehensive metabolic panel  ? Hemoglobin A1c  ? Lipid panel  ? TSH Rfx on Abnormal to Free T4  ? ? ?Return in about 3 months (around 06/19/2022) for blood pressure.  ? ?Complete PHQ at next visit ? ?Anthon Harpole J [place], MD ? ? ?

## 2022-03-19 NOTE — Assessment & Plan Note (Signed)
Routine HCM labs ordered. HCM reviewed/discussed. Anticipatory guidance regarding healthy weight, lifestyle and choices given. Recommend healthy diet.  Recommend approximately 150 minutes/week of moderate intensity exercise Recommend regular dental and vision exams Always use seatbelt/lap and shoulder restraints Recommend using smoke alarms and checking batteries at least twice a year Recommend using sunscreen when outside Discussed tetanus immunization recommendations, patient is UTD 

## 2022-03-19 NOTE — Patient Instructions (Signed)
?  Medication Instructions:  Your physician recommends that you continue on your current medications as directed. Please refer to the Current Medication list given to you today. --If you need a refill on any your medications before your next appointment, please call your pharmacy first. If no refills are authorized on file call the office.-- Lab Work: Your physician has recommended that you have lab work today: No If you have labs (blood work) drawn today and your tests are completely normal, you will receive your results via MyChart message OR a phone call from our staff.  Please ensure you check your voicemail in the event that you authorized detailed messages to be left on a delegated number. If you have any lab test that is abnormal or we need to change your treatment, we will call you to review the results.  Referrals/Procedures/Imaging: No  Follow-Up: Your next appointment:   Your physician recommends that you schedule a follow-up appointment in: 2-3 months with Dr. de [place].  You will receive a text message or e-mail with a link to a survey about your care and experience with us today! We would greatly appreciate your feedback!   Thanks for letting us be apart of your health journey!!  Primary Care and Sports Medicine   Dr. Raymond de [place]   We encourage you to activate your patient portal called "MyChart".  Sign up information is provided on this After Visit Summary.  MyChart is used to connect with patients for Virtual Visits (Telemedicine).  Patients are able to view lab/test results, encounter notes, upcoming appointments, etc.  Non-urgent messages can be sent to your provider as well. To learn more about what you can do with MyChart, please visit --  [email or web address]    

## 2022-03-19 NOTE — Assessment & Plan Note (Signed)
Blood pressure slightly elevated in office today.  No personal history of hypertension, however does have reported family history of hypertension.  Did discuss general lifestyle modifications and provided handout today.  Patient does have home cuff, recommend intermittent monitoring ?We will plan for follow-up in about 2 to 3 months to review blood pressure log and determine next steps such as continuing with lifestyle medications or if pharmacotherapy would need to be initiated ?

## 2022-03-21 ENCOUNTER — Ambulatory Visit (HOSPITAL_BASED_OUTPATIENT_CLINIC_OR_DEPARTMENT_OTHER): Payer: Self-pay | Admitting: Family Medicine

## 2022-03-21 ENCOUNTER — Ambulatory Visit (HOSPITAL_BASED_OUTPATIENT_CLINIC_OR_DEPARTMENT_OTHER): Payer: 59

## 2022-03-21 DIAGNOSIS — Z0000 Encounter for general adult medical examination without abnormal findings: Secondary | ICD-10-CM

## 2022-03-22 LAB — CBC WITH DIFFERENTIAL/PLATELET
Basophils Absolute: 0 10*3/uL (ref 0.0–0.2)
Basos: 0 %
EOS (ABSOLUTE): 0.4 10*3/uL (ref 0.0–0.4)
Eos: 7 %
Hematocrit: 45.4 % (ref 37.5–51.0)
Hemoglobin: 15.3 g/dL (ref 13.0–17.7)
Immature Grans (Abs): 0 10*3/uL (ref 0.0–0.1)
Immature Granulocytes: 0 %
Lymphocytes Absolute: 2.2 10*3/uL (ref 0.7–3.1)
Lymphs: 40 %
MCH: 30.8 pg (ref 26.6–33.0)
MCHC: 33.7 g/dL (ref 31.5–35.7)
MCV: 91 fL (ref 79–97)
Monocytes Absolute: 0.4 10*3/uL (ref 0.1–0.9)
Monocytes: 8 %
Neutrophils Absolute: 2.4 10*3/uL (ref 1.4–7.0)
Neutrophils: 45 %
Platelets: 247 10*3/uL (ref 150–450)
RBC: 4.97 x10E6/uL (ref 4.14–5.80)
RDW: 13.1 % (ref 11.6–15.4)
WBC: 5.4 10*3/uL (ref 3.4–10.8)

## 2022-03-22 LAB — LIPID PANEL
Chol/HDL Ratio: 4.2 ratio (ref 0.0–5.0)
Cholesterol, Total: 237 mg/dL — ABNORMAL HIGH (ref 100–199)
HDL: 57 mg/dL (ref >39)
LDL Chol Calc (NIH): 164 mg/dL — ABNORMAL HIGH (ref 0–99)
Triglycerides: 89 mg/dL (ref 0–149)
VLDL Cholesterol Cal: 16 mg/dL (ref 5–40)

## 2022-03-22 LAB — COMPREHENSIVE METABOLIC PANEL
ALT: 27 IU/L (ref 0–44)
AST: 30 IU/L (ref 0–40)
Albumin/Globulin Ratio: 1.8 (ref 1.2–2.2)
Albumin: 4.9 g/dL (ref 4.0–5.0)
Alkaline Phosphatase: 68 IU/L (ref 44–121)
BUN/Creatinine Ratio: 11 (ref 9–20)
BUN: 9 mg/dL (ref 6–20)
Bilirubin Total: 0.4 mg/dL (ref 0.0–1.2)
CO2: 22 mmol/L (ref 20–29)
Calcium: 9.9 mg/dL (ref 8.7–10.2)
Chloride: 103 mmol/L (ref 96–106)
Creatinine, Ser: 0.83 mg/dL (ref 0.76–1.27)
Globulin, Total: 2.8 g/dL (ref 1.5–4.5)
Glucose: 94 mg/dL (ref 70–99)
Potassium: 4.2 mmol/L (ref 3.5–5.2)
Sodium: 143 mmol/L (ref 134–144)
Total Protein: 7.7 g/dL (ref 6.0–8.5)
eGFR: 119 mL/min/{1.73_m2} (ref >59)

## 2022-03-22 LAB — TSH RFX ON ABNORMAL TO FREE T4: TSH: 0.826 u[IU]/mL (ref 0.450–4.500)

## 2022-03-22 LAB — HEMOGLOBIN A1C
Est. average glucose Bld gHb Est-mCnc: 108 mg/dL
Hgb A1c MFr Bld: 5.4 % (ref 4.8–5.6)

## 2022-06-19 ENCOUNTER — Ambulatory Visit (HOSPITAL_BASED_OUTPATIENT_CLINIC_OR_DEPARTMENT_OTHER): Payer: 59 | Admitting: Family Medicine

## 2022-07-01 ENCOUNTER — Ambulatory Visit (HOSPITAL_BASED_OUTPATIENT_CLINIC_OR_DEPARTMENT_OTHER): Payer: 59 | Admitting: Family Medicine

## 2022-07-01 ENCOUNTER — Encounter (HOSPITAL_BASED_OUTPATIENT_CLINIC_OR_DEPARTMENT_OTHER): Payer: Self-pay | Admitting: Family Medicine

## 2022-07-01 VITALS — BP 139/95 | HR 96 | Ht 67.0 in | Wt 189.1 lb

## 2022-07-01 DIAGNOSIS — D1722 Benign lipomatous neoplasm of skin and subcutaneous tissue of left arm: Secondary | ICD-10-CM | POA: Diagnosis not present

## 2022-07-01 DIAGNOSIS — E785 Hyperlipidemia, unspecified: Secondary | ICD-10-CM | POA: Insufficient documentation

## 2022-07-01 DIAGNOSIS — R030 Elevated blood-pressure reading, without diagnosis of hypertension: Secondary | ICD-10-CM | POA: Diagnosis not present

## 2022-07-01 NOTE — Assessment & Plan Note (Signed)
Patient reports that he has an area of swelling along the medial aspect of left elbow which has been present for many years.  Feels that possibly in recent times, it has slightly increased in size but if anything this has been small.  Has not had any associated symptoms such as pain or restriction in range of motion.  Does not recall any specific injury over the area. On exam, small soft tissue mass that is about 1.5 cm in diameter, nontender, soft.  There is no overlying erythema or warmth.  Normal range of motion at the elbow.  Ultrasound in the office does reveal well-circumscribed solid mass within superficial layers it appears consistent with fatty tissue. History, exam and ultrasound suggest underlying cause as lipoma.  Discussed this finding with patient and treatment options including referral to surgical specialist for consideration of excision versus monitoring.  Given lack of symptoms, he would prefer to continue with monitoring at this time which is reasonable.  Did discuss symptoms to be mindful of, particularly if rapid change in size is noted, pain develops, development of range of motion restriction.  If he does decide that he would like referral to specialist, he may let us know at any time

## 2022-07-01 NOTE — Progress Notes (Signed)
    Procedures performed today:    None.  Independent interpretation of notes and tests performed by another provider:   None.  Brief History, Exam, Impression, and Recommendations:    BP (!) 139/95   Pulse 96   Ht '5\' 7"'$  (1.702 m)   Wt 189 lb 1.6 oz (85.8 kg)   SpO2 100%   BMI 29.62 kg/m   Elevated blood pressure reading without diagnosis of hypertension Blood pressure remains borderline in the office today.  He has been checking his blood pressure at home and systolic readings will tend to be in the 140s or 150s.  Continues to work on diet and exercise, was doing [employer] previously, has transition to doing [employer].  He is enjoying this at present.  No current issues related to chest pain or headaches, no visual concerns. We again discussed importance of continuing with lifestyle modifications, intermittent monitoring of blood pressure at home.  Discussed DASH diet.  For now, can allow for patient to continue with lifestyle modifications with close follow-up to monitor progress and reassess need for pharmacotherapy.  Hyperlipidemia Recent labs with slightly elevated total cholesterol and LDL.  Reviewed these results with patient.  Discussed general recommendations from a lifestyle standpoint, particularly related to limiting saturated fats, red meats, processed foods in the diet and focusing on fresh fruits and vegetables and lean protein.  Continue with exercise as above.  Provided handout today.  Lipoma of left upper extremity Patient reports that he has an area of swelling along the medial aspect of left elbow which has been present for many years.  Feels that possibly in recent times, it has slightly increased in size but if anything this has been small.  Has not had any associated symptoms such as pain or restriction in range of motion.  Does not recall any specific injury over the area. On exam, small soft tissue mass that is about 1.5 cm in diameter, nontender,  soft.  There is no overlying erythema or warmth.  Normal range of motion at the elbow.  Ultrasound in the office does reveal well-circumscribed solid mass within superficial layers it appears consistent with fatty tissue. History, exam and ultrasound suggest underlying cause as lipoma.  Discussed this finding with patient and treatment options including referral to surgical specialist for consideration of excision versus monitoring.  Given lack of symptoms, he would prefer to continue with monitoring at this time which is reasonable.  Did discuss symptoms to be mindful of, particularly if rapid change in size is noted, pain develops, development of range of motion restriction.  If he does decide that he would like referral to specialist, he may let us know at any time  Return in about 3 months (around 10/01/2022).  Offered flu shot today, declined at this time.   ___________________________________________ Travis Villwock de [place], MD, ABFM, CAQSM Primary Care and [place]

## 2022-07-01 NOTE — Patient Instructions (Signed)
  Medication Instructions:  Your physician recommends that you continue on your current medications as directed. Please refer to the Current Medication list given to you today. --If you need a refill on any your medications before your next appointment, please call your pharmacy first. If no refills are authorized on file call the office.-- Lab Work: Your physician has recommended that you have lab work today: No If you have labs (blood work) drawn today and your tests are completely normal, you will receive your results via [place] a phone call from our staff.  Please ensure you check your voicemail in the event that you authorized detailed messages to be left on a delegated number. If you have any lab test that is abnormal or we need to change your treatment, we will call you to review the results.  Referrals/Procedures/Imaging: No  Follow-Up: Your next appointment:   Your physician recommends that you schedule a follow-up appointment in: 3 month follow-up with Dr. de [place]  You will receive a text message or e-mail with a link to a survey about your care and experience with [place] today! We would greatly appreciate your feedback!   Thanks for letting us be apart of your health journey!!  Primary Care and Sports Medicine   Dr. Arlina Robes [place]   We encourage you to activate your patient portal called "MyChart".  Sign up information is provided on this After Visit Summary.  MyChart is used to connect with patients for Virtual Visits (Telemedicine).  Patients are able to view lab/test results, encounter notes, upcoming appointments, etc.  Non-urgent messages can be sent to your provider as well. To learn more about what you can do with MyChart, please visit --  [email or web address].

## 2022-07-01 NOTE — Assessment & Plan Note (Signed)
Recent labs with slightly elevated total cholesterol and LDL.  Reviewed these results with patient.  Discussed general recommendations from a lifestyle standpoint, particularly related to limiting saturated fats, red meats, processed foods in the diet and focusing on fresh fruits and vegetables and lean protein.  Continue with exercise as above.  Provided handout today.

## 2022-07-01 NOTE — Assessment & Plan Note (Signed)
Blood pressure remains borderline in the office today.  He has been checking his blood pressure at home and systolic readings will tend to be in the 140s or 150s.  Continues to work on diet and exercise, was doing [employer] previously, has transition to doing [employer].  He is enjoying this at present.  No current issues related to chest pain or headaches, no visual concerns. We again discussed importance of continuing with lifestyle modifications, intermittent monitoring of blood pressure at home.  Discussed DASH diet.  For now, can allow for patient to continue with lifestyle modifications with close follow-up to monitor progress and reassess need for pharmacotherapy.

## 2022-10-01 ENCOUNTER — Encounter (HOSPITAL_BASED_OUTPATIENT_CLINIC_OR_DEPARTMENT_OTHER): Payer: Self-pay | Admitting: Family Medicine

## 2022-10-01 ENCOUNTER — Ambulatory Visit (HOSPITAL_BASED_OUTPATIENT_CLINIC_OR_DEPARTMENT_OTHER): Payer: 59 | Admitting: Family Medicine

## 2022-10-01 VITALS — BP 124/87 | HR 73 | Temp 97.7°F | Ht 67.0 in | Wt 182.9 lb

## 2022-10-01 DIAGNOSIS — Z0000 Encounter for general adult medical examination without abnormal findings: Secondary | ICD-10-CM

## 2022-10-01 DIAGNOSIS — R030 Elevated blood-pressure reading, without diagnosis of hypertension: Secondary | ICD-10-CM | POA: Diagnosis not present

## 2022-10-01 DIAGNOSIS — R21 Rash and other nonspecific skin eruption: Secondary | ICD-10-CM | POA: Diagnosis not present

## 2022-10-01 MED ORDER — TRIAMCINOLONE ACETONIDE 0.025 % EX CREA: 1.0000 | TOPICAL_CREAM | Freq: Two times a day (BID) | CUTANEOUS | 0 refills | Status: DC

## 2022-10-01 NOTE — Assessment & Plan Note (Signed)
Blood pressure is improved in office today.  He has been checking his blood pressure intermittently at home and reports that readings have been similar to today's reading.  He has not had any issues with chest pain or headaches.  Has been working on lifestyle modifications including reducing alcohol intake, decreasing salt intake, increasing physical activity. Given improved blood pressure reading, likely can continue with monitoring at this time, recommend continuing with intermittent monitoring at home, DASH diet

## 2022-10-01 NOTE — Progress Notes (Signed)
    Procedures performed today:    None.  Independent interpretation of notes and tests performed by another provider:   None.  Brief History, Exam, Impression, and Recommendations:    BP 124/87 (BP Location: Right Arm, Patient Position: Sitting, Cuff Size: Large)   Pulse 73   Temp 97.7 F (36.5 C) (Oral)   Ht '5\' 7"'$  (1.702 m)   Wt 182 lb 14.4 oz (83 kg)   SpO2 100%   BMI 28.65 kg/m   Elevated blood pressure reading without diagnosis of hypertension Blood pressure is improved in office today.  He has been checking his blood pressure intermittently at home and reports that readings have been similar to today's reading.  He has not had any issues with chest pain or headaches.  Has been working on lifestyle modifications including reducing alcohol intake, decreasing salt intake, increasing physical activity. Given improved blood pressure reading, likely can continue with monitoring at this time, recommend continuing with intermittent monitoring at home, DASH diet  Rash He reports that over the past couple weeks, he noticed a rash over his abdomen, located around umbilicus.  Rash has been itchy, has not had any rash noted elsewhere.  Denies any prior similar rashes.  Not aware of any specific dietary changes.  Has not had any GI symptoms such as nausea, diarrhea or constipation. On exam, patient is in no acute distress, vital signs stable.  Over abdomen, scattered areas of slightly raised erythematous areas with some excoriations noted rash is essentially over abdomen surrounding umbilicus with some sparing over abdomen superiorly to umbilicus. Uncertain etiology, will treat initially with lower potency steroid cream and observe response.  Related to this as well as reported skin tags, patient would like to have referral to dermatologist which is reasonable, referral placed today  Return in about 6 months (around 04/01/2023) for CPE with FBW a few days  prior.   ___________________________________________ Luwanda Starr de [place], MD, ABFM, CAQSM Primary Care and [place]

## 2022-10-01 NOTE — Patient Instructions (Signed)
  Medication Instructions:  Your physician recommends that you continue on your current medications as directed. Please refer to the Current Medication list given to you today. --If you need a refill on any your medications before your next appointment, please call your pharmacy first. If no refills are authorized on file call the office.-- Lab Work: Your physician has recommended that you have lab work today: Nurse visit for labs If you have labs (blood work) drawn today and your tests are completely normal, you will receive your results via [place] a phone call from our staff.  Please ensure you check your voicemail in the event that you authorized detailed messages to be left on a delegated number. If you have any lab test that is abnormal or we need to change your treatment, we will call you to review the results.  Referrals/Procedures/Imaging: No  Follow-Up: Your next appointment:   Your physician recommends that you schedule a follow-up appointment in 6 months with Dr. de [place].  You will receive a text message or e-mail with a link to a survey about your care and experience with [place] today! We would greatly appreciate your feedback!   Thanks for letting us be apart of your health journey!!  Primary Care and Sports Medicine   Dr. Arlina Robes [place]   We encourage you to activate your patient portal called "MyChart".  Sign up information is provided on this After Visit Summary.  MyChart is used to connect with patients for Virtual Visits (Telemedicine).  Patients are able to view lab/test results, encounter notes, upcoming appointments, etc.  Non-urgent messages can be sent to your provider as well. To learn more about what you can do with MyChart, please visit --  [email or web address].

## 2022-10-01 NOTE — Assessment & Plan Note (Signed)
He reports that over the past couple weeks, he noticed a rash over his abdomen, located around umbilicus.  Rash has been itchy, has not had any rash noted elsewhere.  Denies any prior similar rashes.  Not aware of any specific dietary changes.  Has not had any GI symptoms such as nausea, diarrhea or constipation. On exam, patient is in no acute distress, vital signs stable.  Over abdomen, scattered areas of slightly raised erythematous areas with some excoriations noted rash is essentially over abdomen surrounding umbilicus with some sparing over abdomen superiorly to umbilicus. Uncertain etiology, will treat initially with lower potency steroid cream and observe response.  Related to this as well as reported skin tags, patient would like to have referral to dermatologist which is reasonable, referral placed today

## 2022-12-11 ENCOUNTER — Telehealth (HOSPITAL_BASED_OUTPATIENT_CLINIC_OR_DEPARTMENT_OTHER): Payer: Self-pay | Admitting: Family Medicine

## 2022-12-11 NOTE — Telephone Encounter (Signed)
Pt Advised NO to the FLU VACCINE

## 2023-03-27 ENCOUNTER — Ambulatory Visit (HOSPITAL_BASED_OUTPATIENT_CLINIC_OR_DEPARTMENT_OTHER): Payer: 59

## 2023-03-28 LAB — CBC WITH DIFFERENTIAL/PLATELET
Basophils Absolute: 0 10*3/uL (ref 0.0–0.2)
Basos: 0 %
EOS (ABSOLUTE): 0.3 10*3/uL (ref 0.0–0.4)
Eos: 4 %
Hematocrit: 46 % (ref 37.5–51.0)
Hemoglobin: 15.4 g/dL (ref 13.0–17.7)
Immature Grans (Abs): 0 10*3/uL (ref 0.0–0.1)
Immature Granulocytes: 0 %
Lymphocytes Absolute: 2.6 10*3/uL (ref 0.7–3.1)
Lymphs: 44 %
MCH: 31 pg (ref 26.6–33.0)
MCHC: 33.5 g/dL (ref 31.5–35.7)
MCV: 93 fL (ref 79–97)
Monocytes Absolute: 0.4 10*3/uL (ref 0.1–0.9)
Monocytes: 7 %
Neutrophils Absolute: 2.7 10*3/uL (ref 1.4–7.0)
Neutrophils: 45 %
Platelets: 228 10*3/uL (ref 150–450)
RBC: 4.96 x10E6/uL (ref 4.14–5.80)
RDW: 12.7 % (ref 11.6–15.4)
WBC: 6 10*3/uL (ref 3.4–10.8)

## 2023-03-28 LAB — LIPID PANEL
Chol/HDL Ratio: 3.3 ratio (ref 0.0–5.0)
Cholesterol, Total: 233 mg/dL — ABNORMAL HIGH (ref 100–199)
HDL: 71 mg/dL (ref >39)
LDL Chol Calc (NIH): 144 mg/dL — ABNORMAL HIGH (ref 0–99)
Triglycerides: 102 mg/dL (ref 0–149)
VLDL Cholesterol Cal: 18 mg/dL (ref 5–40)

## 2023-03-28 LAB — COMPREHENSIVE METABOLIC PANEL
ALT: 23 IU/L (ref 0–44)
AST: 26 IU/L (ref 0–40)
Albumin/Globulin Ratio: 1.8 (ref 1.2–2.2)
Albumin: 4.8 g/dL (ref 4.1–5.1)
Alkaline Phosphatase: 72 IU/L (ref 44–121)
BUN/Creatinine Ratio: 14 (ref 9–20)
BUN: 13 mg/dL (ref 6–20)
Bilirubin Total: <0.2 mg/dL (ref 0.0–1.2)
CO2: 24 mmol/L (ref 20–29)
Calcium: 10 mg/dL (ref 8.7–10.2)
Chloride: 103 mmol/L (ref 96–106)
Creatinine, Ser: 0.9 mg/dL (ref 0.76–1.27)
Globulin, Total: 2.6 g/dL (ref 1.5–4.5)
Glucose: 97 mg/dL (ref 70–99)
Potassium: 5 mmol/L (ref 3.5–5.2)
Sodium: 142 mmol/L (ref 134–144)
Total Protein: 7.4 g/dL (ref 6.0–8.5)
eGFR: 116 mL/min/{1.73_m2} (ref >59)

## 2023-03-28 LAB — HEMOGLOBIN A1C
Est. average glucose Bld gHb Est-mCnc: 108 mg/dL
Hgb A1c MFr Bld: 5.4 % (ref 4.8–5.6)

## 2023-03-28 LAB — TSH RFX ON ABNORMAL TO FREE T4: TSH: 1.32 u[IU]/mL (ref 0.450–4.500)

## 2023-04-01 ENCOUNTER — Encounter (HOSPITAL_BASED_OUTPATIENT_CLINIC_OR_DEPARTMENT_OTHER): Payer: 59 | Admitting: Family Medicine

## 2023-04-16 ENCOUNTER — Encounter (HOSPITAL_BASED_OUTPATIENT_CLINIC_OR_DEPARTMENT_OTHER): Payer: Self-pay | Admitting: Family Medicine

## 2023-04-16 ENCOUNTER — Ambulatory Visit (INDEPENDENT_AMBULATORY_CARE_PROVIDER_SITE_OTHER): Payer: 59 | Admitting: Family Medicine

## 2023-04-16 VITALS — BP 140/90 | HR 86 | Ht 67.0 in | Wt 178.7 lb

## 2023-04-16 DIAGNOSIS — Z0000 Encounter for general adult medical examination without abnormal findings: Secondary | ICD-10-CM

## 2023-04-16 NOTE — Assessment & Plan Note (Addendum)
Routine HCM labs reviewed. HCM reviewed/discussed. Anticipatory guidance regarding healthy weight, lifestyle and choices given. Recommend healthy diet.  Recommend approximately 150 minutes/week of moderate intensity exercise Recommend regular dental and vision exams Always use seatbelt/lap and shoulder restraints Recommend using smoke alarms and checking batteries at least twice a year Recommend using sunscreen when outside Discussed tetanus immunization recommendations, patient is UTD 

## 2023-04-16 NOTE — Progress Notes (Signed)
Subjective:    CC: Annual Physical Exam  HPI:  Travis Huerta is a 32 y.o. presenting for annual physical  I reviewed the past medical history, family history, social history, surgical history, and allergies today and no changes were needed.  Please see the problem list section below in epic for further details.  Past Medical History: History reviewed. No pertinent past medical history. Past Surgical History: Past Surgical History:  Procedure Laterality Date   WISDOM TOOTH EXTRACTION     Social History: Social History   Socioeconomic History   Marital status: Married    Spouse name: Not on file   Number of children: Not on file   Years of education: Not on file   Highest education level: Not on file  Occupational History   Not on file  Tobacco Use   Smoking status: Never   Smokeless tobacco: Never  Vaping Use   Vaping Use: Never used  Substance and Sexual Activity   Alcohol use: Yes    Comment: occasional on the weekends   Drug use: Never   Sexual activity: Not on file  Other Topics Concern   Not on file  Social History Narrative   Not on file   Social Determinants of Health   Financial Resource Strain: Not on file  Food Insecurity: Not on file  Transportation Needs: Not on file  Physical Activity: Not on file  Stress: Not on file  Social Connections: Not on file   Family History: Family History  Problem Relation Age of Onset   Hypertension Father    Diabetes Maternal Grandmother    Allergies: No Known Allergies Medications: See med rec.  Review of Systems: No headache, visual changes, nausea, vomiting, diarrhea, constipation, dizziness, abdominal pain, skin rash, fevers, chills, night sweats, swollen lymph nodes, weight loss, chest pain, body aches, joint swelling, muscle aches, shortness of breath, mood changes, visual or auditory hallucinations.  Objective:    BP (!) 145/90 (BP Location: Right Arm, Patient Position: Sitting, Cuff Size: Normal)    Pulse 86   Ht 5\' 7"  (1.702 m)   Wt 178 lb 11.2 oz (81.1 kg)   SpO2 100% Comment: RA  BMI 27.99 kg/m   General: Well Developed, well nourished, and in no acute distress. Neuro: Alert and oriented x3, extra-ocular muscles intact, sensation grossly intact. Cranial nerves II through XII are intact, motor, sensory, and coordinative functions are all intact. HEENT: Normocephalic, atraumatic, pupils equal round reactive to light, neck supple, no masses, no lymphadenopathy, thyroid nonpalpable. Oropharynx, nasopharynx, external ear canals are unremarkable. Skin: Warm and dry, no rashes noted. Cardiac: Regular rate and rhythm, no murmurs rubs or gallops. Respiratory: Clear to auscultation bilaterally. Not using accessory muscles, speaking in full sentences. Abdominal: Soft, nontender, nondistended, positive bowel sounds, no masses, no organomegaly. Musculoskeletal: Shoulder, elbow, wrist, hip, knee, ankle stable, and with full range of motion.  Impression and Recommendations:    Wellness examination Assessment & Plan: Routine HCM labs reviewed. HCM reviewed/discussed. Anticipatory guidance regarding healthy weight, lifestyle and choices given. Recommend healthy diet.  Recommend approximately 150 minutes/week of moderate intensity exercise Recommend regular dental and vision exams Always use seatbelt/lap and shoulder restraints Recommend using smoke alarms and checking batteries at least twice a year Recommend using sunscreen when outside Discussed tetanus immunization recommendations, patient is UTD    Return in about 6 months (around 10/16/2023) for blood pressure.   ___________________________________________ [place] Flett [place], MD, ABFM, CAQSM Primary Care and Sports Medicine [facility]

## 2023-10-16 ENCOUNTER — Ambulatory Visit (HOSPITAL_BASED_OUTPATIENT_CLINIC_OR_DEPARTMENT_OTHER): Payer: 59 | Admitting: Family Medicine

## 2023-11-13 ENCOUNTER — Ambulatory Visit (HOSPITAL_BASED_OUTPATIENT_CLINIC_OR_DEPARTMENT_OTHER): Payer: 59 | Admitting: Family Medicine

## 2023-12-11 ENCOUNTER — Ambulatory Visit (INDEPENDENT_AMBULATORY_CARE_PROVIDER_SITE_OTHER): Payer: 59 | Admitting: Family Medicine

## 2023-12-11 ENCOUNTER — Encounter (HOSPITAL_BASED_OUTPATIENT_CLINIC_OR_DEPARTMENT_OTHER): Payer: Self-pay | Admitting: Family Medicine

## 2023-12-11 VITALS — BP 126/87 | HR 98 | Ht 67.0 in | Wt 180.5 lb

## 2023-12-11 DIAGNOSIS — R030 Elevated blood-pressure reading, without diagnosis of hypertension: Secondary | ICD-10-CM | POA: Diagnosis not present

## 2023-12-11 DIAGNOSIS — Z23 Encounter for immunization: Secondary | ICD-10-CM | POA: Diagnosis not present

## 2023-12-11 DIAGNOSIS — Z0000 Encounter for general adult medical examination without abnormal findings: Secondary | ICD-10-CM

## 2023-12-11 NOTE — Assessment & Plan Note (Addendum)
 Blood pressure is improved in office today.  He has been checking his blood pressure intermittently at home and reports that readings have been similar to today's reading.  He has not had any issues with chest pain or headaches.  Has been working on lifestyle modifications including reducing alcohol intake, decreasing salt intake, increasing physical activity. Given improved blood pressure reading, can continue with monitoring at this time, recommend continuing with intermittent monitoring at home, DASH diet

## 2023-12-11 NOTE — Patient Instructions (Signed)
  Medication Instructions:  Your physician recommends that you continue on your current medications as directed. Please refer to the Current Medication list given to you today. --If you need a refill on any your medications before your next appointment, please call your pharmacy first. If no refills are authorized on file call the office.-- Lab Work: Your physician has recommended that you have lab work today: blood work 1 week before visit If you have labs (blood work) drawn today and your tests are completely normal, you will receive your results via MyChart message OR a phone call from our staff.  Please ensure you check your voicemail in the event that you authorized detailed messages to be left on a delegated number. If you have any lab test that is abnormal or we need to change your treatment, we will call you to review the results.   Follow-Up: Your next appointment:   Your physician recommends that you schedule a follow-up appointment in: 5 mths (after June 13th) for physical  with Dr. de [place]  You will receive a text message or e-mail with a link to a survey about your care and experience with us  today! We would greatly appreciate your feedback!   Thanks for letting us  be apart of your health journey!!  Primary Care and Sports Medicine   Dr. Quintin sheerer [place]   We encourage you to activate your patient portal called MyChart.  Sign up information is provided on this After Visit Summary.  MyChart is used to connect with patients for Virtual Visits (Telemedicine).  Patients are able to view lab/test results, encounter notes, upcoming appointments, etc.  Non-urgent messages can be sent to your provider as well. To learn more about what you can do with MyChart, please visit --  [email or web address].

## 2023-12-11 NOTE — Progress Notes (Signed)
    Procedures performed today:    None.  Independent interpretation of notes and tests performed by another provider:   None.  Brief History, Exam, Impression, and Recommendations:    BP 126/87 (BP Location: Left Arm, Patient Position: Sitting, Cuff Size: Normal)   Pulse 98   Ht 5' 7 (1.702 m)   Wt 180 lb 8 oz (81.9 kg)   SpO2 100%   BMI 28.27 kg/m   Elevated blood pressure reading without diagnosis of hypertension Assessment & Plan: Blood pressure is improved in office today.  He has been checking his blood pressure intermittently at home and reports that readings have been similar to today's reading.  He has not had any issues with chest pain or headaches.  Has been working on lifestyle modifications including reducing alcohol intake, decreasing salt intake, increasing physical activity. Given improved blood pressure reading, can continue with monitoring at this time, recommend continuing with intermittent monitoring at home, DASH diet   Wellness examination -     CBC with Differential/Platelet; Future -     Comprehensive metabolic panel; Future -     Hemoglobin A1c; Future -     Lipid panel; Future -     TSH Rfx on Abnormal to Free T4; Future  Encounter for immunization -     Flu vaccine trivalent PF, 6mos and older(Flulaval,Afluria,Fluarix,Fluzone)  Return in about 5 months (around 05/09/2024) for CPE with fasting labs 1 week prior.   ___________________________________________ Makinzy Cleere de [place], MD, ABFM, CAQSM Primary Care and Sports Medicine [facility]

## 2024-05-05 ENCOUNTER — Other Ambulatory Visit (HOSPITAL_BASED_OUTPATIENT_CLINIC_OR_DEPARTMENT_OTHER): Payer: 59

## 2024-05-12 ENCOUNTER — Encounter (HOSPITAL_BASED_OUTPATIENT_CLINIC_OR_DEPARTMENT_OTHER): Payer: 59 | Admitting: Family Medicine

## 2024-06-16 ENCOUNTER — Encounter (HOSPITAL_BASED_OUTPATIENT_CLINIC_OR_DEPARTMENT_OTHER): Admitting: Family Medicine

## 2024-11-10 ENCOUNTER — Encounter (HOSPITAL_BASED_OUTPATIENT_CLINIC_OR_DEPARTMENT_OTHER): Admitting: Family Medicine
